# Patient Record
Sex: Female | Born: 1970 | Race: Black or African American | Hispanic: No | Marital: Single | Smoking: Never smoker
Health system: Southern US, Community
[De-identification: ages and names within clinical notes are randomized; demographics above are authoritative.]

## PROBLEM LIST (undated history)

## (undated) DIAGNOSIS — I10 Essential (primary) hypertension: Secondary | ICD-10-CM

## (undated) DIAGNOSIS — E119 Type 2 diabetes mellitus without complications: Secondary | ICD-10-CM

## (undated) DIAGNOSIS — H9190 Unspecified hearing loss, unspecified ear: Secondary | ICD-10-CM

## (undated) HISTORY — PX: ABDOMINAL HYSTERECTOMY: SHX81

---

## 2017-09-16 ENCOUNTER — Emergency Department (HOSPITAL_BASED_OUTPATIENT_CLINIC_OR_DEPARTMENT_OTHER): Payer: Medicare HMO

## 2017-09-16 ENCOUNTER — Other Ambulatory Visit: Payer: Self-pay

## 2017-09-16 ENCOUNTER — Emergency Department (HOSPITAL_BASED_OUTPATIENT_CLINIC_OR_DEPARTMENT_OTHER)
Admission: EM | Admit: 2017-09-16 | Discharge: 2017-09-17 | Disposition: A | Discharge status: Home / Self Care | Payer: Medicare HMO | Attending: Emergency Medicine | Admitting: Emergency Medicine

## 2017-09-16 ENCOUNTER — Encounter (HOSPITAL_BASED_OUTPATIENT_CLINIC_OR_DEPARTMENT_OTHER): Payer: Self-pay | Admitting: Emergency Medicine

## 2017-09-16 DIAGNOSIS — E876 Hypokalemia: Secondary | ICD-10-CM | POA: Insufficient documentation

## 2017-09-16 DIAGNOSIS — E119 Type 2 diabetes mellitus without complications: Secondary | ICD-10-CM | POA: Insufficient documentation

## 2017-09-16 DIAGNOSIS — R1110 Vomiting, unspecified: Secondary | ICD-10-CM | POA: Diagnosis present

## 2017-09-16 DIAGNOSIS — K529 Noninfective gastroenteritis and colitis, unspecified: Secondary | ICD-10-CM | POA: Diagnosis not present

## 2017-09-16 DIAGNOSIS — I10 Essential (primary) hypertension: Secondary | ICD-10-CM | POA: Diagnosis not present

## 2017-09-16 HISTORY — DX: Unspecified hearing loss, unspecified ear: H91.90

## 2017-09-16 HISTORY — DX: Essential (primary) hypertension: I10

## 2017-09-16 HISTORY — DX: Type 2 diabetes mellitus without complications: E11.9

## 2017-09-16 LAB — URINALYSIS, ROUTINE W REFLEX MICROSCOPIC
BILIRUBIN URINE: NEGATIVE
Glucose, UA: >=500 mg/dL — AB
Hgb urine dipstick: NEGATIVE
KETONES UR: NEGATIVE mg/dL
Leukocytes, UA: NEGATIVE
NITRITE: NEGATIVE
PROTEIN: NEGATIVE mg/dL
pH: 6.5 (ref 5.0–8.0)

## 2017-09-16 LAB — CBC
HCT: 39.6 % (ref 36.0–46.0)
HEMOGLOBIN: 13.1 g/dL (ref 12.0–15.0)
MCH: 26.3 pg (ref 26.0–34.0)
MCHC: 33.1 g/dL (ref 30.0–36.0)
MCV: 79.5 fL (ref 78.0–100.0)
Platelets: 514 10*3/uL — ABNORMAL HIGH (ref 150–400)
RBC: 4.98 MIL/uL (ref 3.87–5.11)
RDW: 14.4 % (ref 11.5–15.5)
WBC: 8.4 10*3/uL (ref 4.0–10.5)

## 2017-09-16 LAB — COMPREHENSIVE METABOLIC PANEL
ALK PHOS: 168 U/L — AB (ref 38–126)
ALT: 22 U/L (ref 0–44)
ANION GAP: 11 (ref 5–15)
AST: 23 U/L (ref 15–41)
Albumin: 3.4 g/dL — ABNORMAL LOW (ref 3.5–5.0)
BUN: 8 mg/dL (ref 6–20)
CALCIUM: 8.3 mg/dL — AB (ref 8.9–10.3)
CO2: 33 mmol/L — ABNORMAL HIGH (ref 22–32)
Chloride: 95 mmol/L — ABNORMAL LOW (ref 98–111)
Creatinine, Ser: 0.63 mg/dL (ref 0.44–1.00)
GFR calc Af Amer: >60 mL/min (ref >60)
GLUCOSE: 98 mg/dL (ref 70–99)
Potassium: 2.7 mmol/L — CL (ref 3.5–5.1)
SODIUM: 139 mmol/L (ref 135–145)
TOTAL PROTEIN: 7.9 g/dL (ref 6.5–8.1)
Total Bilirubin: 0.3 mg/dL (ref 0.3–1.2)

## 2017-09-16 LAB — MAGNESIUM: MAGNESIUM: 2.3 mg/dL (ref 1.7–2.4)

## 2017-09-16 LAB — URINALYSIS, MICROSCOPIC (REFLEX)

## 2017-09-16 LAB — LIPASE, BLOOD: LIPASE: 19 U/L (ref 11–51)

## 2017-09-16 MED ORDER — ONDANSETRON HCL 4 MG/2ML IJ SOLN: 4.0000 mg | Freq: Once | INTRAMUSCULAR | Status: DC

## 2017-09-16 MED ORDER — SODIUM CHLORIDE 0.9 % IV BOLUS
1000.0000 mL | Freq: Once | INTRAVENOUS | Status: AC
  Administered 2017-09-16: 1000 mL via INTRAVENOUS

## 2017-09-16 MED ORDER — POTASSIUM CHLORIDE CRYS ER 20 MEQ PO TBCR
40.0000 meq | EXTENDED_RELEASE_TABLET | Freq: Once | ORAL | Status: AC
  Administered 2017-09-17: 40 meq via ORAL
  Filled 2017-09-16: qty 2

## 2017-09-16 MED ORDER — IOPAMIDOL (ISOVUE-300) INJECTION 61%
100.0000 mL | Freq: Once | INTRAVENOUS | Status: AC | PRN
  Administered 2017-09-16: 100 mL via INTRAVENOUS

## 2017-09-16 MED ORDER — MORPHINE SULFATE (PF) 4 MG/ML IV SOLN: 4.0000 mg | Freq: Once | INTRAVENOUS | Status: DC

## 2017-09-16 MED ORDER — POTASSIUM CHLORIDE 10 MEQ/100ML IV SOLN
10.0000 meq | Freq: Once | INTRAVENOUS | Status: DC
  Filled 2017-09-16: qty 100

## 2017-09-16 MED ORDER — POTASSIUM CHLORIDE 10 MEQ/100ML IV SOLN
10.0000 meq | Freq: Once | INTRAVENOUS | Status: AC
  Administered 2017-09-16: 10 meq via INTRAVENOUS

## 2017-09-16 MED ORDER — SODIUM CHLORIDE 0.9 % IV SOLN
INTRAVENOUS | Status: DC | PRN
  Administered 2017-09-16: 1000 mL via INTRAVENOUS

## 2017-09-16 NOTE — ED Provider Notes (Signed)
[facility] EMERGENCY DEPARTMENT Provider Note   CSN: [id number] Arrival date & time: 09/16/17  1911     History   Chief Complaint Chief Complaint  Patient presents with  . Emesis    HPI Lauren Ali is a 47 y.o. female.  HPI  Lauren Ali is a 47 year old female with a history of type 2 diabetes and hypertension who presents to the emergency department for evaluation of nausea/vomiting, diarrhea and right-sided abdominal pain that radiates to the back.  Patient reports that her symptoms began yesterday.  Reports that abdominal pain has gradually worsened, comes in waves without any apparent trigger.  The pain feels aching and is located over the the right side of the abdomen and radiates towards the right flank.  Pain fluctuates between a 3/10 to 7/10 in severity.  Pain is worsened with laying on the right side and improved with laying flat on her back.  She tried taking some Tylenol which did not improve her symptoms.  She has had about 4-5 episodes of nonbloody, nonbilious emesis daily.  She is also had about 5 episodes of nonbloody diarrhea today.  Also had tactile fever with chills yesterday.  She has had prior hysterectomy and C-section, no other reported abdominal surgeries.  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  She denies any recent antibiotic use, hospitalizations or travel outside of the country.  She denies chest pain, shortness of breath, dysuria, urinary frequency, hematuria, vaginal discharge, vaginal bleeding, lightheadedness or syncope.  She checks her blood sugars at home which usually run in the 120s.  Past Medical History:  Diagnosis Date  . Diabetes mellitus without complication (HCC)   . Hard of hearing   . Hypertension     There are no active problems to display for this patient.   Past Surgical History:  Procedure Laterality Date  . ABDOMINAL HYSTERECTOMY       OB History    None      Home Medications    Prior to Admission medications   Not on File    Family History No family history on file.  Social History Social History   Tobacco Use  . Smoking status: Never Smoker  . Smokeless tobacco: Never Used  Substance Use Topics  . Alcohol use: Never    Frequency: Never  . Drug use: Not on file     Allergies   Lisinopril and Metformin and related   Review of Systems Review of Systems  Constitutional: Positive for chills and fever.  Eyes: Negative for visual disturbance.  Respiratory: Negative for shortness of breath.   Cardiovascular: Negative for chest pain.  Gastrointestinal: Positive for abdominal pain, diarrhea, nausea and vomiting. Negative for blood in stool.  Genitourinary: Negative for dysuria, flank pain, frequency, hematuria, vaginal bleeding and vaginal discharge.  Musculoskeletal: Negative for back pain.  Skin: Negative for wound.  Neurological: Negative for syncope and light-headedness.  Psychiatric/Behavioral: Negative for agitation.     Physical Exam Updated Vital Signs BP 109/73 (BP Location: Left Arm)   Pulse 82   Temp 99.4 F (37.4 C) (Oral)   Resp 18   Ht 5\' 3"  (1.6 m)   Wt 100.2 kg   SpO2 98%   BMI 39.15 kg/m   Physical Exam  Constitutional: She is oriented to person, place, and time. She appears well-developed and well-nourished. No distress.  HENT:  Head: Normocephalic and atraumatic.  Mucous membranes somewhat  dry  Eyes: Pupils are equal, round, and reactive to light. Conjunctivae are normal. Right eye exhibits no discharge. Left eye exhibits no discharge.  Neck: Normal range of motion. Neck supple.  Cardiovascular: Normal rate, regular rhythm and intact distal pulses.  No murmur heard. Pulmonary/Chest: Effort normal and breath sounds normal. No stridor. No respiratory distress. She has no wheezes. She has no rales.  Abdominal:  Abdomen soft and nondistended.  Tender to palpation in the right upper  and lower quadrants as well as right flank area.  No guarding, rebound or rigidity.  No CVA tenderness.  Musculoskeletal: Normal range of motion.  Neurological: She is alert and oriented to person, place, and time. Coordination normal.  Skin: Skin is warm and dry. She is not diaphoretic.  Psychiatric: She has a normal mood and affect. Her behavior is normal.  Nursing note and vitals reviewed.   ED Treatments / Results  Labs (all labs ordered are listed, but only abnormal results are displayed) Labs Reviewed  COMPREHENSIVE METABOLIC PANEL - Abnormal; Notable for the following components:      Result Value   Potassium 2.7 (*)    Chloride 95 (*)    CO2 33 (*)    Calcium 8.3 (*)    Albumin 3.4 (*)    Alkaline Phosphatase 168 (*)    All other components within normal limits  CBC - Abnormal; Notable for the following components:   Platelets 514 (*)    All other components within normal limits  URINALYSIS, ROUTINE W REFLEX MICROSCOPIC - Abnormal; Notable for the following components:   Specific Gravity, Urine <1.005 (*)    Glucose, UA >=500 (*)    All other components within normal limits  URINALYSIS, MICROSCOPIC (REFLEX) - Abnormal; Notable for the following components:   Bacteria, UA RARE (*)    All other components within normal limits  LIPASE, BLOOD  MAGNESIUM    EKG EKG Interpretation  Date/Time:  Sunday September 16 2017 22:36:36 EDT Ventricular Rate:  75 PR Interval:    QRS Duration: 100 QT Interval:  386 QTC Calculation: 432 R Axis:   61 Text Interpretation:  Sinus rhythm Borderline repolarization abnormality No oprior ECG for comparison.  T wave inversion in leads V2-V5.  No STEMI Confirmed by Theda Belfast ([id number]) on 09/16/2017 11:57:06 PM   Radiology Ct Abdomen Pelvis W Contrast  Result Date: 09/16/2017 CLINICAL DATA:  Right sided abdominal pain x1 week with vomiting x2 days and diarrhea. EXAM: CT ABDOMEN AND PELVIS WITH CONTRAST TECHNIQUE: Multidetector CT  imaging of the abdomen and pelvis was performed using the standard protocol following bolus administration of intravenous contrast. CONTRAST:  ISOVUE-300 IOPAMIDOL (ISOVUE-300) INJECTION 61% COMPARISON:  None. FINDINGS: Lower chest: Top-normal size heart.  No active pulmonary disease. Hepatobiliary: No focal liver abnormality is seen. No gallstones, gallbladder wall thickening, or biliary dilatation. Pancreas: Unremarkable. No pancreatic ductal dilatation or surrounding inflammatory changes. Spleen: Small splenule adjacent to normal size spleen.  No mass. Adrenals/Urinary Tract: Normal bilateral adrenal glands and kidneys without obstructive uropathy or mass. No hydroureteronephrosis. The urinary bladder is unremarkable. Stomach/Bowel: Small hiatal hernia. Decompressed stomach with normal small bowel rotation. Mild transmural thickening of fluid-filled small bowel loops in a pattern consistent with small bowel enteritis. Normal appendix and colon. Vascular/Lymphatic: Mild aortic atherosclerosis. No aneurysm. No lymphadenopathy. Reproductive: Hysterectomy.  No adnexal mass. Other: No free air nor free fluid. Small periumbilical fat containing hernia. Musculoskeletal: Slight disc space narrowing with endplate spurring V7-B9. No aggressive osseous lesions  nor fracture. IMPRESSION: Transmural thickening of fluid-filled mildly distended small bowel loops without mechanical bowel obstruction are identified in a pattern compatible with small bowel enteritis. Status post hysterectomy. Electronically Signed   By: Tollie Eth M.D.   On: 09/16/2017 23:30    Procedures Procedures (including critical care time)  Medications Ordered in ED Medications  ondansetron (ZOFRAN) injection 4 mg (4 mg Intravenous Not Given 09/16/17 2140)  potassium chloride 10 mEq in 100 mL IVPB ( Intravenous Rate/Dose Change 09/16/17 2338)  0.9 %  sodium chloride infusion ( Intravenous Rate/Dose Change 09/16/17 2337)  potassium chloride SA  (K-DUR,KLOR-CON) CR tablet 40 mEq (has no administration in time range)  sodium chloride 0.9 % bolus 1,000 mL (0 mLs Intravenous Stopped 09/16/17 2319)  iopamidol (ISOVUE-300) 61 % injection 100 mL (100 mLs Intravenous Contrast Given 09/16/17 2253)     Initial Impression / Assessment and Plan / ED Course  I have reviewed the triage vital signs and the nursing notes.  Pertinent labs & imaging results that were available during my care of the patient were reviewed by me and considered in my medical decision making (see chart for details).     CT abdomen/pelvis reveals transmural thickening of fluid-filled mildly distended small bowel loops without obstruction consistent with small bowel enteritis.  Patient symptoms began yesterday and she does report recent contact with someone who had similar symptoms.  Likely viral etiology.  She is afebrile and nontoxic in the emergency department.  CMP reveals hypokalemia with potassium 2.7.  This is likely related to her recent vomiting.  Magnesium within normal limits.  Patient treated with p.o. potassium as well as IV potassium.  Her EKG shows T wave inversions in V2-V5, no prior to compare to.  She denies chest pain or shortness of breath and I do not suspect ACS.  Otherwise urine without acute infection.  Lipase negative.  CBC unremarkable.  Patient tolerating p.o. fluids at the bedside.  Plan to discharge home with instructions on BRAT diet and zofran for nausea. Will also discharge with a a few days of PO potassium, and have her follow up with her PCP for recheck this week for repeat labs. Discussed return precautions and she agrees and appears reliable.   Final Clinical Impressions(s) / ED Diagnoses   Final diagnoses:  Enteritis  Hypokalemia    ED Discharge Orders         Ordered    ondansetron (ZOFRAN ODT) 4 MG disintegrating tablet  Every 8 hours PRN     09/17/17 0015    potassium chloride 20 MEQ TBCR  Daily     09/17/17 0015             Kellie Shropshire, PA-C 09/17/17 0017    Tegeler, Canary Brim, MD 09/17/17 [phone]

## 2017-09-16 NOTE — ED Notes (Signed)
ED Provider at bedside. 

## 2017-09-16 NOTE — ED Notes (Signed)
Pt expressed to EMT "I do not want the zofran or morphine. That morphine is too strong for me. I want something like tylenol or aleve." RN notified. PA notified.

## 2017-09-16 NOTE — ED Notes (Signed)
Called lab and add magnesium on

## 2017-09-16 NOTE — ED Triage Notes (Signed)
Pt c/o vomiting since yesterday.  

## 2017-09-16 NOTE — ED Notes (Signed)
Date and time results received: 09/16/17 .10:35 PM   (use smartphrase ".now" to insert current time)  Test: Serum Potassium Critical Value: 2.7  Name of Provider Notified: Claudia Desanctis, PA  Orders Received? Or Actions Taken: Orders received

## 2017-09-17 DIAGNOSIS — K529 Noninfective gastroenteritis and colitis, unspecified: Secondary | ICD-10-CM | POA: Diagnosis not present

## 2017-09-17 MED ORDER — ONDANSETRON 4 MG PO TBDP: 4.0000 mg | ORAL_TABLET | Freq: Three times a day (TID) | ORAL | 0 refills | Status: AC | PRN

## 2017-09-17 MED ORDER — POTASSIUM CHLORIDE ER 20 MEQ PO TBCR: 20.0000 meq | EXTENDED_RELEASE_TABLET | Freq: Every day | ORAL | 0 refills | Status: AC

## 2017-09-17 NOTE — Discharge Instructions (Addendum)
You have stomach flu.  Take Zofran as needed for nausea and vomiting at home.  Stick with a bland diet, no greasy or spicy foods.  Your potassium was low today, please take potassium pill daily for the next 5 days and follow-up with your regular doctor for recheck later this week.  Return to the ER if you have any new or worsening symptoms like vomiting that will not stop, fever, worsening abdominal pain.

## 2017-09-17 NOTE — ED Notes (Signed)
Water given for PO challenge.

## 2019-01-27 ENCOUNTER — Emergency Department (HOSPITAL_BASED_OUTPATIENT_CLINIC_OR_DEPARTMENT_OTHER): Payer: Medicare HMO

## 2019-01-27 ENCOUNTER — Emergency Department (HOSPITAL_BASED_OUTPATIENT_CLINIC_OR_DEPARTMENT_OTHER)
Admission: EM | Admit: 2019-01-27 | Discharge: 2019-01-27 | Disposition: A | Discharge status: Home / Self Care | Payer: Medicare HMO | Attending: Emergency Medicine | Admitting: Emergency Medicine

## 2019-01-27 ENCOUNTER — Other Ambulatory Visit: Payer: Self-pay

## 2019-01-27 ENCOUNTER — Encounter (HOSPITAL_BASED_OUTPATIENT_CLINIC_OR_DEPARTMENT_OTHER): Payer: Self-pay | Admitting: *Deleted

## 2019-01-27 DIAGNOSIS — U071 COVID-19: Secondary | ICD-10-CM | POA: Insufficient documentation

## 2019-01-27 DIAGNOSIS — E876 Hypokalemia: Secondary | ICD-10-CM | POA: Diagnosis not present

## 2019-01-27 DIAGNOSIS — Z79899 Other long term (current) drug therapy: Secondary | ICD-10-CM | POA: Diagnosis not present

## 2019-01-27 DIAGNOSIS — I10 Essential (primary) hypertension: Secondary | ICD-10-CM | POA: Diagnosis not present

## 2019-01-27 DIAGNOSIS — Z888 Allergy status to other drugs, medicaments and biological substances status: Secondary | ICD-10-CM | POA: Insufficient documentation

## 2019-01-27 DIAGNOSIS — E1165 Type 2 diabetes mellitus with hyperglycemia: Secondary | ICD-10-CM | POA: Diagnosis present

## 2019-01-27 DIAGNOSIS — Z794 Long term (current) use of insulin: Secondary | ICD-10-CM | POA: Diagnosis not present

## 2019-01-27 DIAGNOSIS — R739 Hyperglycemia, unspecified: Secondary | ICD-10-CM

## 2019-01-27 LAB — COMPREHENSIVE METABOLIC PANEL
ALT: 28 U/L (ref 0–44)
AST: 29 U/L (ref 15–41)
Albumin: 3.6 g/dL (ref 3.5–5.0)
Alkaline Phosphatase: 182 U/L — ABNORMAL HIGH (ref 38–126)
Anion gap: 13 (ref 5–15)
BUN: 20 mg/dL (ref 6–20)
CO2: 29 mmol/L (ref 22–32)
Calcium: 8.4 mg/dL — ABNORMAL LOW (ref 8.9–10.3)
Chloride: 91 mmol/L — ABNORMAL LOW (ref 98–111)
Creatinine, Ser: 1.11 mg/dL — ABNORMAL HIGH (ref 0.44–1.00)
GFR calc Af Amer: >60 mL/min (ref >60)
GFR calc non Af Amer: 59 mL/min — ABNORMAL LOW (ref >60)
Glucose, Bld: 300 mg/dL — ABNORMAL HIGH (ref 70–99)
Potassium: 2.9 mmol/L — ABNORMAL LOW (ref 3.5–5.1)
Sodium: 133 mmol/L — ABNORMAL LOW (ref 135–145)
Total Bilirubin: 0.2 mg/dL — ABNORMAL LOW (ref 0.3–1.2)
Total Protein: 8.3 g/dL — ABNORMAL HIGH (ref 6.5–8.1)

## 2019-01-27 LAB — CBC WITH DIFFERENTIAL/PLATELET
Abs Immature Granulocytes: 0.01 10*3/uL (ref 0.00–0.07)
Basophils Absolute: 0 10*3/uL (ref 0.0–0.1)
Basophils Relative: 0 %
Eosinophils Absolute: 0 10*3/uL (ref 0.0–0.5)
Eosinophils Relative: 0 %
HCT: 43.4 % (ref 36.0–46.0)
Hemoglobin: 13.7 g/dL (ref 12.0–15.0)
Immature Granulocytes: 0 %
Lymphocytes Relative: 30 %
Lymphs Abs: 1.3 10*3/uL (ref 0.7–4.0)
MCH: 25.2 pg — ABNORMAL LOW (ref 26.0–34.0)
MCHC: 31.6 g/dL (ref 30.0–36.0)
MCV: 79.9 fL — ABNORMAL LOW (ref 80.0–100.0)
Monocytes Absolute: 0.5 10*3/uL (ref 0.1–1.0)
Monocytes Relative: 11 %
Neutro Abs: 2.5 10*3/uL (ref 1.7–7.7)
Neutrophils Relative %: 59 %
Platelets: 430 10*3/uL — ABNORMAL HIGH (ref 150–400)
RBC: 5.43 MIL/uL — ABNORMAL HIGH (ref 3.87–5.11)
RDW: 14.6 % (ref 11.5–15.5)
WBC: 4.3 10*3/uL (ref 4.0–10.5)
nRBC: 0 % (ref 0.0–0.2)

## 2019-01-27 LAB — SARS CORONAVIRUS 2 AG (30 MIN TAT): SARS Coronavirus 2 Ag: POSITIVE — AB

## 2019-01-27 LAB — CBG MONITORING, ED
Glucose-Capillary: 165 mg/dL — ABNORMAL HIGH (ref 70–99)
Glucose-Capillary: 175 mg/dL — ABNORMAL HIGH (ref 70–99)

## 2019-01-27 MED ORDER — POTASSIUM CHLORIDE CRYS ER 20 MEQ PO TBCR
40.0000 meq | EXTENDED_RELEASE_TABLET | Freq: Once | ORAL | Status: AC
  Administered 2019-01-27: 40 meq via ORAL
  Filled 2019-01-27: qty 2

## 2019-01-27 MED ORDER — INSULIN REGULAR HUMAN 100 UNIT/ML IJ SOLN: 3.0000 [IU] | Freq: Once | INTRAMUSCULAR | Status: DC

## 2019-01-27 MED ORDER — POTASSIUM CHLORIDE CRYS ER 20 MEQ PO TBCR: 20.0000 meq | EXTENDED_RELEASE_TABLET | Freq: Two times a day (BID) | ORAL | 0 refills | Status: AC

## 2019-01-27 NOTE — Discharge Instructions (Signed)
Person Under Monitoring Name: Lauren Ali  Location: [address] [place] [address]   Infection Prevention Recommendations for Individuals Confirmed to have, or Being Evaluated for, 2019 Novel Coronavirus (COVID-19) Infection Who Receive Care at Home  Individuals who are confirmed to have, or are being evaluated for, COVID-19 should follow the prevention steps below until a healthcare provider or local or state health department says they can return to normal activities.  Stay home except to get medical care You should restrict activities outside your home, except for getting medical care. Do not go to work, school, or public areas, and do not use public transportation or taxis.  Call ahead before visiting your doctor Before your medical appointment, call the healthcare provider and tell them that you have, or are being evaluated for, COVID-19 infection. This will help the healthcare provider's office take steps to keep other people from getting infected. Ask your healthcare provider to call the local or state health department.  Monitor your symptoms Seek prompt medical attention if your illness is worsening (e.g., difficulty breathing). Before going to your medical appointment, call the healthcare provider and tell them that you have, or are being evaluated for, COVID-19 infection. Ask your healthcare provider to call the local or state health department.  Wear a facemask You should wear a facemask that covers your nose and mouth when you are in the same room with other people and when you visit a healthcare provider. People who live with or visit you should also wear a facemask while they are in the same room with you.  Separate yourself from other people in your home As much as possible, you should stay in a different room from other people in your home. Also, you should use a separate bathroom, if available.  Avoid sharing household items You should not share  dishes, drinking glasses, cups, eating utensils, towels, bedding, or other items with other people in your home. After using these items, you should wash them thoroughly with soap and water.  Cover your coughs and sneezes Cover your mouth and nose with a tissue when you cough or sneeze, or you can cough or sneeze into your sleeve. Throw used tissues in a lined trash can, and immediately wash your hands with soap and water for at least 20 seconds or use an alcohol-based hand rub.  Wash your [employer] your hands often and thoroughly with soap and water for at least 20 seconds. You can use an alcohol-based hand sanitizer if soap and water are not available and if your hands are not visibly dirty. Avoid touching your eyes, nose, and mouth with unwashed hands.   Prevention Steps for Caregivers and Household Members of Individuals Confirmed to have, or Being Evaluated for, COVID-19 Infection Being Cared for in the Home  If you live with, or provide care at home for, a person confirmed to have, or being evaluated for, COVID-19 infection please follow these guidelines to prevent infection:  Follow healthcare provider's instructions Make sure that you understand and can help the patient follow any healthcare provider instructions for all care.  Provide for the patient's basic needs You should help the patient with basic needs in the home and provide support for getting groceries, prescriptions, and other personal needs.  Monitor the patient's symptoms If they are getting sicker, call his or her medical provider and tell them that the patient has, or is being evaluated for, COVID-19 infection. This will help the healthcare provider's office  take steps to keep other people from getting infected. Ask the healthcare provider to call the local or state health department.  Limit the number of people who have contact with the patient If possible, have only one caregiver for the patient. Other  household members should stay in another home or place of residence. If this is not possible, they should stay in another room, or be separated from the patient as much as possible. Use a separate bathroom, if available. Restrict visitors who do not have an essential need to be in the home.  Keep older adults, very young children, and other sick people away from the patient Keep older adults, very young children, and those who have compromised immune systems or chronic health conditions away from the patient. This includes people with chronic heart, lung, or kidney conditions, diabetes, and cancer.  Ensure good ventilation Make sure that shared spaces in the home have good air flow, such as from an air conditioner or an opened window, weather permitting.  Wash your hands often Wash your hands often and thoroughly with soap and water for at least 20 seconds. You can use an alcohol based hand sanitizer if soap and water are not available and if your hands are not visibly dirty. Avoid touching your eyes, nose, and mouth with unwashed hands. Use disposable paper towels to dry your hands. If not available, use dedicated cloth towels and replace them when they become wet.  Wear a facemask and gloves Wear a disposable facemask at all times in the room and gloves when you touch or have contact with the patient's blood, body fluids, and/or secretions or excretions, such as sweat, saliva, sputum, nasal mucus, vomit, urine, or feces.  Ensure the mask fits over your nose and mouth tightly, and do not touch it during use. Throw out disposable facemasks and gloves after using them. Do not reuse. Wash your hands immediately after removing your facemask and gloves. If your personal clothing becomes contaminated, carefully remove clothing and launder. Wash your hands after handling contaminated clothing. Place all used disposable facemasks, gloves, and other waste in a lined container before disposing them with  other household waste. Remove gloves and wash your hands immediately after handling these items.  Do not share dishes, glasses, or other household items with the patient Avoid sharing household items. You should not share dishes, drinking glasses, cups, eating utensils, towels, bedding, or other items with a patient who is confirmed to have, or being evaluated for, COVID-19 infection. After the person uses these items, you should wash them thoroughly with soap and water.  Wash laundry thoroughly Immediately remove and wash clothes or bedding that have blood, body fluids, and/or secretions or excretions, such as sweat, saliva, sputum, nasal mucus, vomit, urine, or feces, on them. Wear gloves when handling laundry from the patient. Read and follow directions on labels of laundry or clothing items and detergent. In general, wash and dry with the warmest temperatures recommended on the label.  Clean all areas the individual has used often Clean all touchable surfaces, such as counters, tabletops, doorknobs, bathroom fixtures, toilets, phones, keyboards, tablets, and bedside tables, every day. Also, clean any surfaces that may have blood, body fluids, and/or secretions or excretions on them. Wear gloves when cleaning surfaces the patient has come in contact with. Use a diluted bleach solution (e.g., dilute bleach with 1 part bleach and 10 parts water) or a household disinfectant with a label that says EPA-registered for coronaviruses. To make a bleach  solution at home, add 1 tablespoon of bleach to 1 quart (4 cups) of water. For a larger supply, add  cup of bleach to 1 gallon (16 cups) of water. Read labels of cleaning products and follow recommendations provided on product labels. Labels contain instructions for safe and effective use of the cleaning product including precautions you should take when applying the product, such as wearing gloves or eye protection and making sure you have good ventilation  during use of the product. Remove gloves and wash hands immediately after cleaning.  Monitor yourself for signs and symptoms of illness Caregivers and household members are considered close contacts, should monitor their health, and will be asked to limit movement outside of the home to the extent possible. Follow the monitoring steps for close contacts listed on the symptom monitoring form.   ? If you have additional questions, contact your local health department or call the epidemiologist on call at [phone] (available 24/7). ? This guidance is subject to change. For the most up-to-date guidance from [facility], please refer to their website: YouBlogs.pl

## 2019-01-27 NOTE — ED Notes (Signed)
ED Provider at bedside. 

## 2019-01-27 NOTE — ED Notes (Signed)
Xray bedside.

## 2019-01-27 NOTE — ED Triage Notes (Signed)
Fatigue. She is diabetic and her sugar is high. Abdominal full feeling.

## 2019-01-27 NOTE — ED Notes (Signed)
Vital signs stable. 

## 2019-01-27 NOTE — ED Notes (Signed)
Called for pt in [facility] but no answer.

## 2019-01-27 NOTE — ED Notes (Signed)
Pt ambulated out of room for bathroom with no problems, explained to pt that with her precautions she will need to stay in room. Bedside toilet brought into room.

## 2019-01-27 NOTE — ED Notes (Signed)
Pt in room sleeping, updated vitals and explained delay. Pt appreciative.

## 2019-01-28 ENCOUNTER — Encounter (HOSPITAL_BASED_OUTPATIENT_CLINIC_OR_DEPARTMENT_OTHER): Payer: Self-pay | Admitting: Emergency Medicine

## 2019-01-28 MED FILL — POTASSIUM CHLORIDE CRYS ER: 20 | 5 days supply | Qty: 10 | Fill #0

## 2019-01-28 NOTE — ED Provider Notes (Signed)
[facility] Provider Note   CSN: [id number] Arrival date & time: 01/27/19  1622     History Chief Complaint  Patient presents with  . Hyperglycemia    + covid    Lauren Ali is a 49 y.o. female.  The history is provided by the patient.  Hyperglycemia Severity:  Moderate Onset quality:  Gradual Timing:  Constant Progression:  Unchanged Chronicity:  Recurrent Diabetes status:  Controlled with insulin and controlled with oral medications Current diabetic therapy:  Insulin, treseba, actos Context: recent illness   Context comment:  Has covid Relieved by:  Nothing Ineffective treatments:  None tried Associated symptoms: no abdominal pain, no altered mental status, no blurred vision, no chest pain, no dehydration, no dizziness, no fatigue, no increased appetite, no increased thirst, no nausea, no shortness of breath, no syncope and no vomiting   Risk factors: obesity   Risk factors: no family hx of diabetes   Patient presents with increased sugars since being diagnosed with covid.       Past Medical History:  Diagnosis Date  . Diabetes mellitus without complication ([place])   . Hard of hearing   . Hypertension     There are no problems to display for this patient.   Past Surgical History:  Procedure Laterality Date  . ABDOMINAL HYSTERECTOMY       OB History   No obstetric history on file.     History reviewed. No pertinent family history.  Social History   Tobacco Use  . Smoking status: Never Smoker  . Smokeless tobacco: Never Used  Substance Use Topics  . Alcohol use: Never  . Drug use: Not on file    Home Medications Prior to Admission medications   Medication Sig Start Date End Date Taking? Authorizing Provider  atorvastatin (LIPITOR) 40 MG tablet Take by mouth. 07/03/18  Yes [provider]  Blood Glucose Monitoring Suppl (FIFTY50 GLUCOSE METER 2.0) w/Device KIT Use as instructed. E11.65 03/06/16  Yes  [provider]  chlorthalidone (HYGROTON) 25 MG tablet TAKE 1 TABLET BY MOUTH EVERY DAY 07/25/18  Yes [provider]  empagliflozin (JARDIANCE) 25 MG TABS tablet Take by mouth. 06/17/18  Yes [provider]  fluconazole (DIFLUCAN) 200 MG tablet TAKE 1 TABLET BY MOUTH AS NEEDED. REPEAT DOSE AFTER 2 DAYS IF SYMPTOMS STILL PERSIST 12/26/16  Yes [provider]  glucose blood (ONETOUCH ULTRA) test strip USE TO TEST BLOOD SUGAR THREE TIMES DAILY 03/25/18  Yes [provider]  insulin degludec (TRESIBA) 100 UNIT/ML SOPN FlexTouch Pen Inject 80 units daily. 06/17/18  Yes [provider]  Insulin Pen Needle (MM PEN NEEDLES) 32G X 4 MM MISC USE DAILY AS DIRECTED WITH LEVEMIR 01/16/19  Yes [provider]  pioglitazone (ACTOS) 30 MG tablet Take by mouth. 08/07/18  Yes [provider]  potassium chloride SA (KLOR-CON) 20 MEQ tablet Take by mouth. 09/17/17  Yes [provider]  ondansetron (ZOFRAN ODT) 4 MG disintegrating tablet Take 1 tablet (4 mg total) by mouth every 8 (eight) hours as needed for nausea or vomiting. 09/17/17   Glyn Ade, PA-C  potassium chloride 20 MEQ TBCR Take 20 mEq by mouth daily for 5 days. 09/17/17 09/22/17  Glyn Ade, PA-C  potassium chloride SA (KLOR-CON) 20 MEQ tablet Take 1 tablet (20 mEq total) by mouth 2 (two) times daily. 01/27/19   Glendine Swetz, MD    Allergies    Lisinopril and Metformin and related  Review  of Systems   Review of Systems  Constitutional: Negative for fatigue.  HENT: Negative for congestion.   Eyes: Negative for blurred vision and visual disturbance.  Respiratory: Negative for shortness of breath.   Cardiovascular: Negative for chest pain and syncope.  Gastrointestinal: Negative for abdominal pain, nausea and vomiting.  Endocrine: Negative for polydipsia.  Genitourinary: Negative for difficulty urinating.  Musculoskeletal: Negative for arthralgias.  Neurological:  Negative for dizziness.  Psychiatric/Behavioral: Negative for agitation.  All other systems reviewed and are negative.   Physical Exam Updated Vital Signs BP 110/69 (BP Location: Right Arm)   Pulse 81   Temp 99 F (37.2 C) (Oral)   Resp 17   Ht 5' 3"  (1.6 m)   Wt 103.9 kg   SpO2 98%   BMI 40.57 kg/m   Physical Exam Vitals and nursing note reviewed.  Constitutional:      General: She is not in acute distress.    Appearance: Normal appearance.  HENT:     Head: Normocephalic and atraumatic.     Nose: Nose normal.  Eyes:     Conjunctiva/sclera: Conjunctivae normal.     Pupils: Pupils are equal, round, and reactive to light.  Cardiovascular:     Rate and Rhythm: Normal rate and regular rhythm.     Pulses: Normal pulses.     Heart sounds: Normal heart sounds.  Pulmonary:     Effort: Pulmonary effort is normal.     Breath sounds: Normal breath sounds.  Abdominal:     General: Abdomen is flat. Bowel sounds are normal.     Tenderness: There is no abdominal tenderness. There is no guarding or rebound.     Hernia: No hernia is present.  Musculoskeletal:        General: Normal range of motion.     Cervical back: Normal range of motion and neck supple.  Skin:    General: Skin is warm and dry.     Capillary Refill: Capillary refill takes less than 2 seconds.  Neurological:     General: No focal deficit present.     Mental Status: She is alert and oriented to person, place, and time.  Psychiatric:        Mood and Affect: Mood normal.        Behavior: Behavior normal.     ED Results / Procedures / Treatments   Labs (all labs ordered are listed, but only abnormal results are displayed) Results for orders placed or performed during the hospital encounter of 01/27/19  SARS Coronavirus 2 Ag (30 min TAT) - Nasal Swab (BD Veritor Kit)   Specimen: Nasal Swab (BD Veritor Kit)  Result Value Ref Range   SARS Coronavirus 2 Ag POSITIVE (A) NEGATIVE  CBC with Differential  Result  Value Ref Range   WBC 4.3 4.0 - 10.5 K/uL   RBC 5.43 (H) 3.87 - 5.11 MIL/uL   Hemoglobin 13.7 12.0 - 15.0 g/dL   HCT 43.4 36.0 - 46.0 %   MCV 79.9 (L) 80.0 - 100.0 fL   MCH 25.2 (L) 26.0 - 34.0 pg   MCHC 31.6 30.0 - 36.0 g/dL   RDW 14.6 11.5 - 15.5 %   Platelets 430 (H) 150 - 400 K/uL   nRBC 0.0 0.0 - 0.2 %   Neutrophils Relative % 59 %   Neutro Abs 2.5 1.7 - 7.7 K/uL   Lymphocytes Relative 30 %   Lymphs Abs 1.3 0.7 - 4.0 K/uL   Monocytes Relative 11 %  Monocytes Absolute 0.5 0.1 - 1.0 K/uL   Eosinophils Relative 0 %   Eosinophils Absolute 0.0 0.0 - 0.5 K/uL   Basophils Relative 0 %   Basophils Absolute 0.0 0.0 - 0.1 K/uL   Immature Granulocytes 0 %   Abs Immature Granulocytes 0.01 0.00 - 0.07 K/uL  Comprehensive metabolic panel  Result Value Ref Range   Sodium 133 (L) 135 - 145 mmol/L   Potassium 2.9 (L) 3.5 - 5.1 mmol/L   Chloride 91 (L) 98 - 111 mmol/L   CO2 29 22 - 32 mmol/L   Glucose, Bld 300 (H) 70 - 99 mg/dL   BUN 20 6 - 20 mg/dL   Creatinine, Ser 1.11 (H) 0.44 - 1.00 mg/dL   Calcium 8.4 (L) 8.9 - 10.3 mg/dL   Total Protein 8.3 (H) 6.5 - 8.1 g/dL   Albumin 3.6 3.5 - 5.0 g/dL   AST 29 15 - 41 U/L   ALT 28 0 - 44 U/L   Alkaline Phosphatase 182 (H) 38 - 126 U/L   Total Bilirubin 0.2 (L) 0.3 - 1.2 mg/dL   GFR calc non Af Amer 59 (L) >60 mL/min   GFR calc Af Amer >60 >60 mL/min   Anion gap 13 5 - 15  POC CBG, ED  Result Value Ref Range   Glucose-Capillary 165 (H) 70 - 99 mg/dL  POC CBG, ED  Result Value Ref Range   Glucose-Capillary 175 (H) 70 - 99 mg/dL   DG Chest Port 1 View  Result Date: 01/27/2019 CLINICAL DATA:  COVID positive EXAM: PORTABLE CHEST 1 VIEW COMPARISON:  December 25, 2015 FINDINGS: The heart size and mediastinal contours are within normal limits. Both lungs are clear. The visualized skeletal structures are unremarkable. IMPRESSION: No active disease. Electronically Signed   By: Prudencio Pair M.D.   On: 01/27/2019 23:05     Radiology DG Chest  Port 1 View  Result Date: 01/27/2019 CLINICAL DATA:  COVID positive EXAM: PORTABLE CHEST 1 VIEW COMPARISON:  December 25, 2015 FINDINGS: The heart size and mediastinal contours are within normal limits. Both lungs are clear. The visualized skeletal structures are unremarkable. IMPRESSION: No active disease. Electronically Signed   By: Prudencio Pair M.D.   On: 01/27/2019 23:05    Procedures Procedures (including critical care time)  Medications Ordered in ED Medications  potassium chloride SA (KLOR-CON) CR tablet 40 mEq (40 mEq Oral Given 01/27/19 2333)    ED Course  I have reviewed the triage vital signs and the nursing notes.  Pertinent labs & imaging results that were available during my care of the patient were reviewed by me and considered in my medical decision making (see chart for details).    pATIENT has chronic hypokalemia, this has been seen time and again on labs.  I have given some in the ED and written for more to be taken at home.  Her sugar is now fine.  I have explained that her sugar could be up due to the stress of infection and she must watch her diet more closely during the infection as she she check her sugar more often at home.  She is well appearing. CXR and vitals and exam are benign and reassuring.    Lauren Ali was evaluated in Emergency Department on 01/28/2019 for the symptoms described in the history of present illness. She was evaluated in the context of the global COVID-19 pandemic, which necessitated consideration that the patient might be at risk for infection with the  SARS-CoV-2 virus that causes COVID-19. Institutional protocols and algorithms that pertain to the evaluation of patients at risk for COVID-19 are in a state of rapid change based on information released by regulatory bodies including the CDC and federal and state organizations. These policies and algorithms were followed during the patient's care in the ED.  Final Clinical Impression(s) / ED  Diagnoses Final diagnoses:  Hyperglycemia  COVID-19  Hypokalemia   Return for intractable cough, coughing up blood,fevers >100.4 unrelieved by medication, shortness of breath, intractable vomiting, chest pain, shortness of breath, weakness,numbness, changes in speech, facial asymmetry,abdominal pain, passing out,Inability to tolerate liquids or food, cough, altered mental status or any concerns. No signs of systemic illness or infection. The patient is nontoxic-appearing on exam and vital signs are within normal limits.   I have reviewed the triage vital signs and the nursing notes. Pertinent labs &imaging results that were available during my care of the patient were reviewed by me and considered in my medical decision making (see chart for details).  After history, exam, and medical workup I feel the patient has been appropriately medically screened and is safe for discharge home. Pertinent diagnoses were discussed with the patient. Patient was given return Rx / DC Orders ED Discharge Orders         Ordered    potassium chloride SA (KLOR-CON) 20 MEQ tablet  2 times daily     01/27/19 2326           Hazely Sealey, MD 01/28/19 6815

## 2019-02-05 ENCOUNTER — Telehealth (HOSPITAL_BASED_OUTPATIENT_CLINIC_OR_DEPARTMENT_OTHER): Payer: Self-pay | Admitting: Emergency Medicine

## 2019-02-08 ENCOUNTER — Telehealth (HOSPITAL_BASED_OUTPATIENT_CLINIC_OR_DEPARTMENT_OTHER): Payer: Self-pay | Admitting: Emergency Medicine

## 2019-12-11 IMAGING — CT CT ABD-PELV W/ CM
2 of 5 series · 17 of 46 positions shown, 19 images · IV contrast (APPLIED)
Comparison: None.

CLINICAL DATA: Right sided abdominal pain x1 week with vomiting x2
days and diarrhea.

EXAM:
CT ABDOMEN AND PELVIS WITH CONTRAST
TECHNIQUE: Multidetector CT imaging of the abdomen and pelvis was performed
using the standard protocol following bolus administration of
intravenous contrast.
CONTRAST:  100mL QO8IM2-XJJ IOPAMIDOL (QO8IM2-XJJ) INJECTION 61%

[Series 2: axial st · axial · 0.90mm/px · z∈[-415,+15]mm · 14 of 96 slices shown, 16 images]
[im 5/96  soft-tissue]
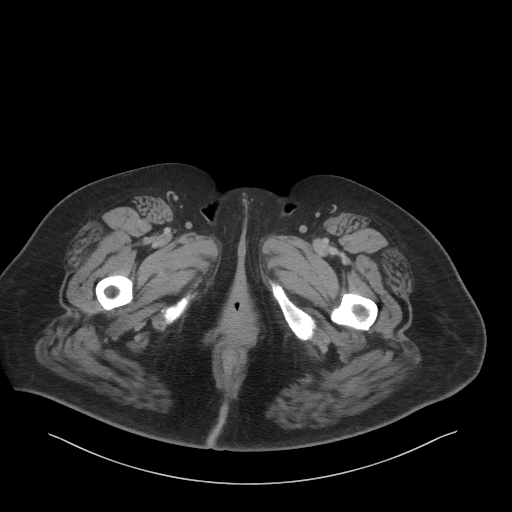
[im 5/96  bone]
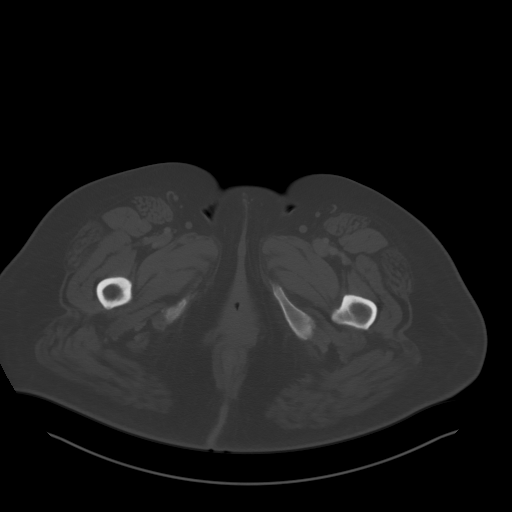
[im 15/96  soft-tissue]
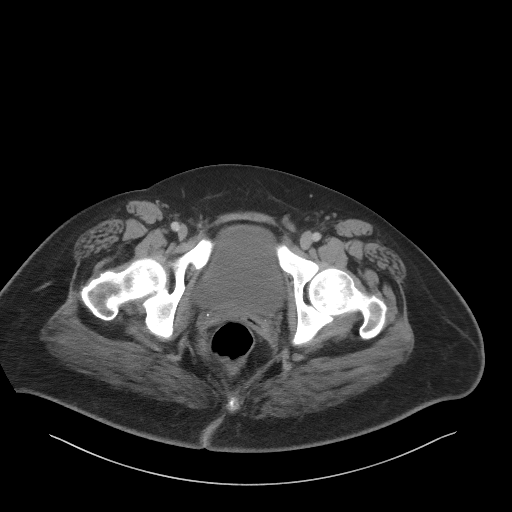
[im 20/96  soft-tissue]
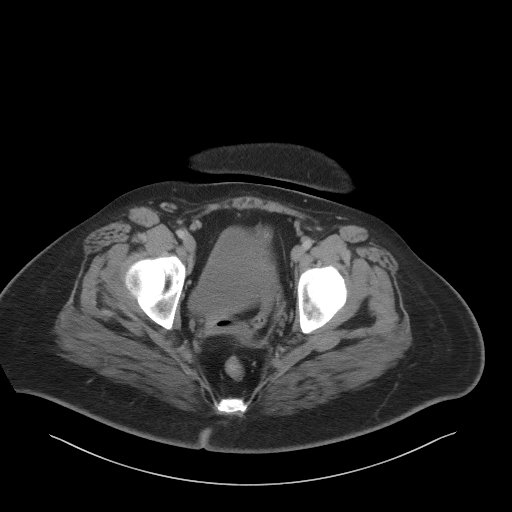
[im 24/96  soft-tissue]
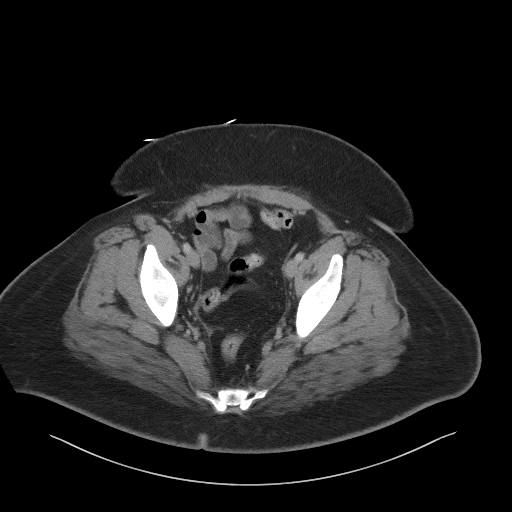
[im 34/96  soft-tissue]
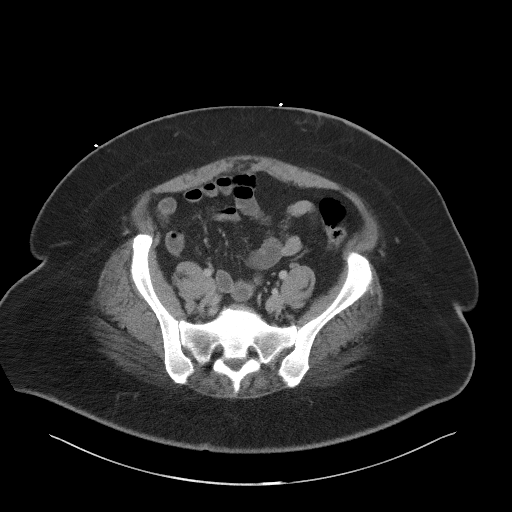
[im 39/96  soft-tissue]
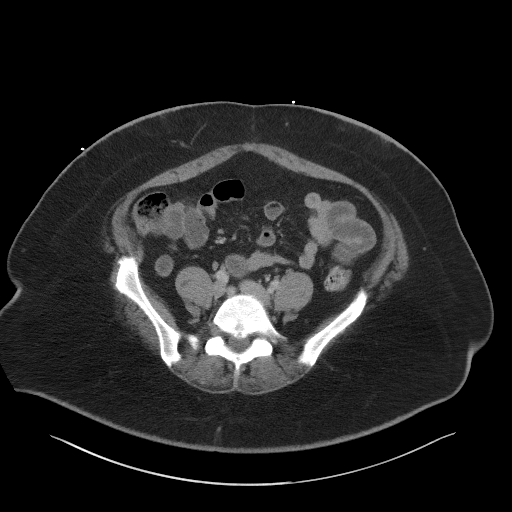
[im 43/96  soft-tissue]
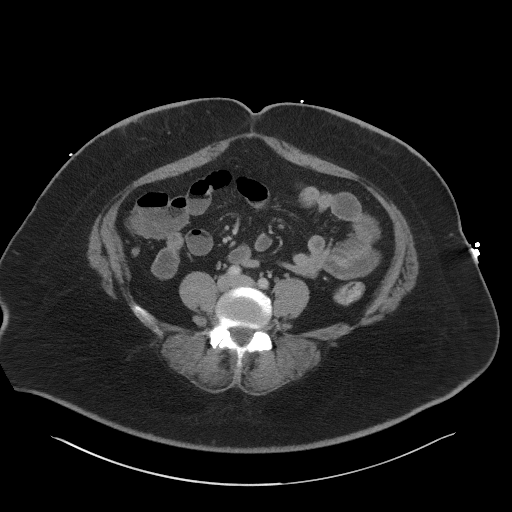
[im 53/96  soft-tissue]
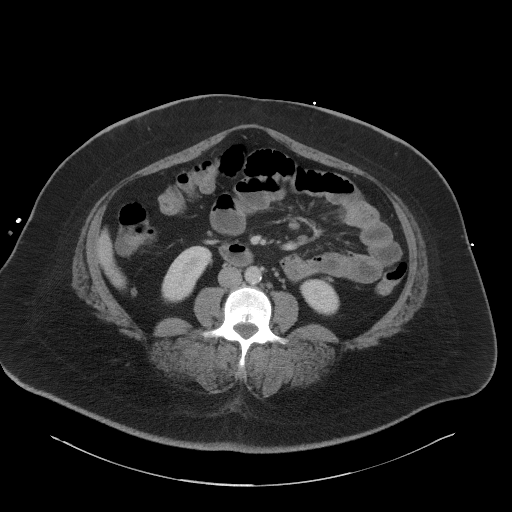
[im 58/96  soft-tissue]
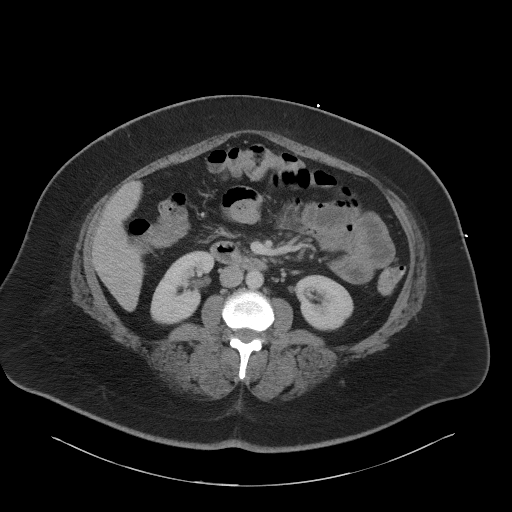
[im 58/96  bone]
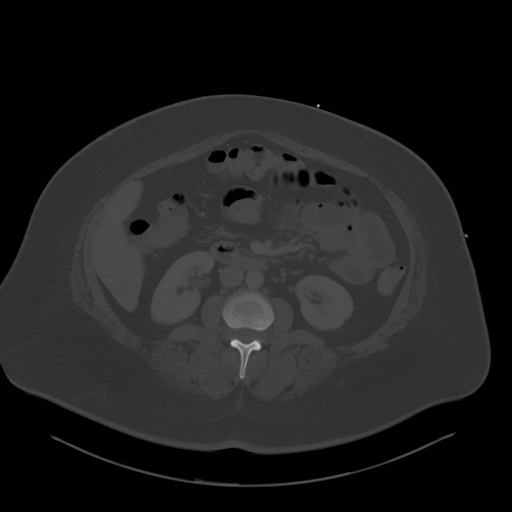
[im 62/96  soft-tissue]
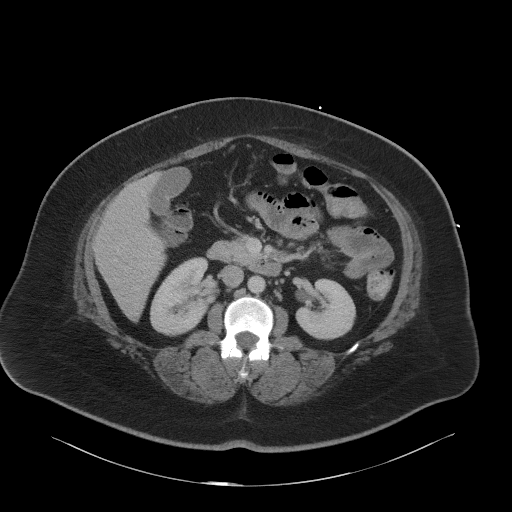
[im 72/96  soft-tissue]
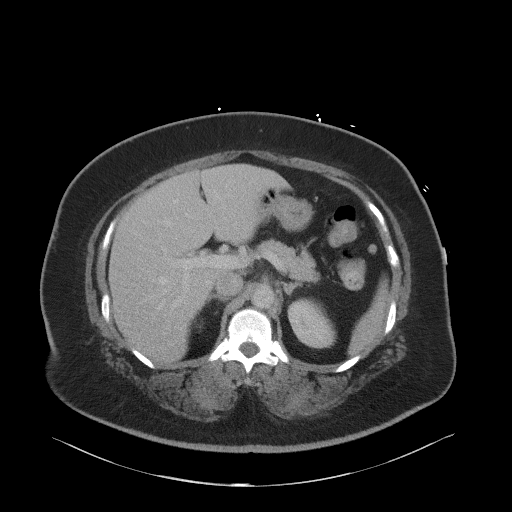
[im 77/96  soft-tissue]
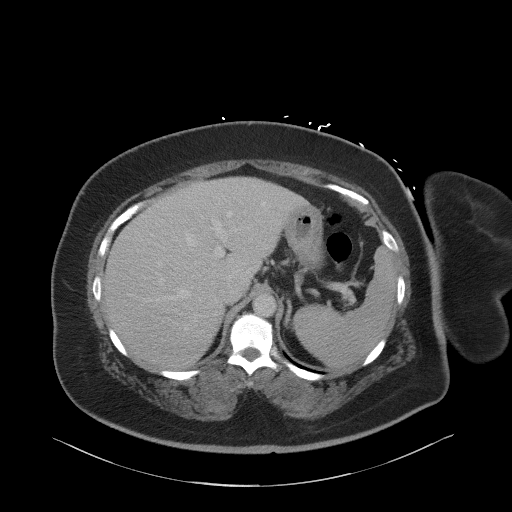
[im 81/96  soft-tissue]
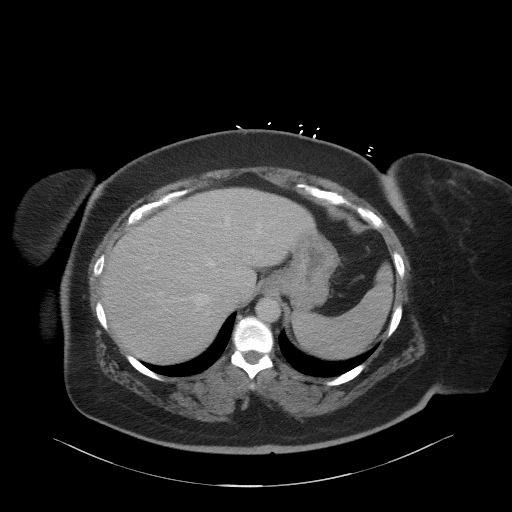
[im 91/96  soft-tissue]
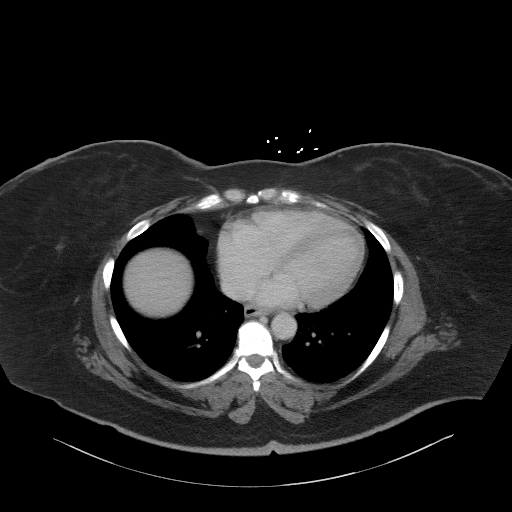

[Series 5: coronal st · coronal · 0.95mm/px · 3 of 109 slices shown]
[im 37/109  soft-tissue]
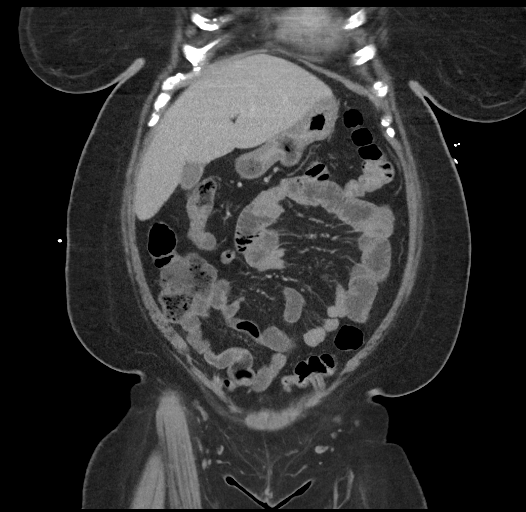
[im 49/109  soft-tissue]
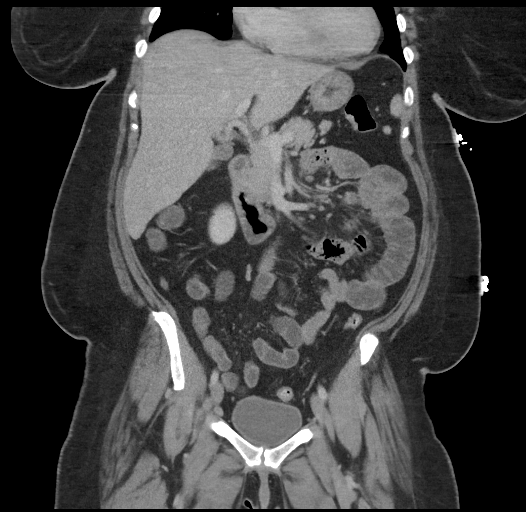
[im 61/109  soft-tissue]
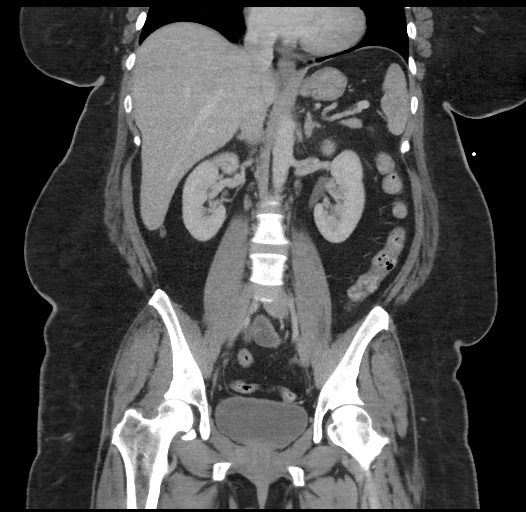

[17 of 46 positions shown; findings below may reference images not displayed]

FINDINGS: Lower chest: Top-normal size heart.  No active pulmonary disease.

Hepatobiliary: No focal liver abnormality is seen. No gallstones,
gallbladder wall thickening, or biliary dilatation.

Pancreas: Unremarkable. No pancreatic ductal dilatation or
surrounding inflammatory changes.

Spleen: Small splenule adjacent to normal size spleen.  No mass.

Adrenals/Urinary Tract: Normal bilateral adrenal glands and kidneys
without obstructive uropathy or mass. No hydroureteronephrosis. The
urinary bladder is unremarkable.

Stomach/Bowel: Small hiatal hernia. Decompressed stomach with normal
small bowel rotation. Mild transmural thickening of fluid-filled
small bowel loops in a pattern consistent with small bowel
enteritis. Normal appendix and colon.

Vascular/Lymphatic: Mild aortic atherosclerosis. No aneurysm. No
lymphadenopathy.

Reproductive: Hysterectomy.  No adnexal mass.

Other: No free air nor free fluid. Small periumbilical fat
containing hernia.

Musculoskeletal: Slight disc space narrowing with endplate spurring
L5-S1. No aggressive osseous lesions nor fracture.
IMPRESSION: Transmural thickening of fluid-filled mildly distended small bowel
loops without mechanical bowel obstruction are identified in a
pattern compatible with small bowel enteritis.

Status post hysterectomy.

## 2020-11-21 ENCOUNTER — Emergency Department (HOSPITAL_BASED_OUTPATIENT_CLINIC_OR_DEPARTMENT_OTHER)
Admission: EM | Admit: 2020-11-21 | Discharge: 2020-11-21 | Disposition: A | Discharge status: Home / Self Care | Payer: Medicare HMO | Attending: Emergency Medicine | Admitting: Emergency Medicine

## 2020-11-21 ENCOUNTER — Encounter (HOSPITAL_BASED_OUTPATIENT_CLINIC_OR_DEPARTMENT_OTHER): Payer: Self-pay | Admitting: Urology

## 2020-11-21 DIAGNOSIS — E119 Type 2 diabetes mellitus without complications: Secondary | ICD-10-CM | POA: Diagnosis not present

## 2020-11-21 DIAGNOSIS — T162XXA Foreign body in left ear, initial encounter: Secondary | ICD-10-CM | POA: Diagnosis present

## 2020-11-21 DIAGNOSIS — Z794 Long term (current) use of insulin: Secondary | ICD-10-CM | POA: Diagnosis not present

## 2020-11-21 DIAGNOSIS — X58XXXA Exposure to other specified factors, initial encounter: Secondary | ICD-10-CM | POA: Diagnosis not present

## 2020-11-21 DIAGNOSIS — Z79899 Other long term (current) drug therapy: Secondary | ICD-10-CM | POA: Insufficient documentation

## 2020-11-21 DIAGNOSIS — Z7984 Long term (current) use of oral hypoglycemic drugs: Secondary | ICD-10-CM | POA: Insufficient documentation

## 2020-11-21 DIAGNOSIS — I10 Essential (primary) hypertension: Secondary | ICD-10-CM | POA: Diagnosis not present

## 2020-11-21 NOTE — ED Provider Notes (Signed)
[facility] Provider Note   CSN: [id number] Arrival date & time: 11/21/20  2302     History Chief Complaint  Patient presents with   Foreign Body in [place] is a 50 y.o. female.  Patient with history of diabetes presents today with hearing aid piece stuck in her ear.  States that prior to arrival she went to remove her hearing aid and the tip came off and remained lodged in her ear.  She denies any other complaints, no fevers or chills.  No ear pain or discharge.  The history is provided by the patient. No language interpreter was used.  Foreign Body in Ear      Past Medical History:  Diagnosis Date   Diabetes mellitus without complication ([place])    Hard of hearing    Hypertension     There are no problems to display for this patient.   Past Surgical History:  Procedure Laterality Date   ABDOMINAL HYSTERECTOMY       OB History   No obstetric history on file.     History reviewed. No pertinent family history.  Social History   Tobacco Use   Smoking status: Never   Smokeless tobacco: Never  Substance Use Topics   Alcohol use: Never    Home Medications Prior to Admission medications   Medication Sig Start Date End Date Taking? Authorizing Provider  atorvastatin (LIPITOR) 40 MG tablet Take by mouth. 07/03/18   [provider]  Blood Glucose Monitoring Suppl (FIFTY50 GLUCOSE METER 2.0) w/Device KIT Use as instructed. E11.65 03/06/16   [provider]  chlorthalidone (HYGROTON) 25 MG tablet TAKE 1 TABLET BY MOUTH EVERY DAY 07/25/18   [provider]  empagliflozin (JARDIANCE) 25 MG TABS tablet Take by mouth. 06/17/18   [provider]  fluconazole (DIFLUCAN) 200 MG tablet TAKE 1 TABLET BY MOUTH AS NEEDED. REPEAT DOSE AFTER 2 DAYS IF SYMPTOMS STILL PERSIST 12/26/16   [provider]  glucose blood (ONETOUCH ULTRA) test strip USE TO TEST BLOOD SUGAR THREE TIMES DAILY 03/25/18    [provider]  insulin degludec (TRESIBA) 100 UNIT/ML SOPN FlexTouch Pen Inject 80 units daily. 06/17/18   [provider]  Insulin Pen Needle (MM PEN NEEDLES) 32G X 4 MM MISC USE DAILY AS DIRECTED WITH LEVEMIR 01/16/19   [provider]  ondansetron (ZOFRAN ODT) 4 MG disintegrating tablet Take 1 tablet (4 mg total) by mouth every 8 (eight) hours as needed for nausea or vomiting. 09/17/17   Glyn Ade, PA-C  pioglitazone (ACTOS) 30 MG tablet Take by mouth. 08/07/18   [provider]  potassium chloride 20 MEQ TBCR Take 20 mEq by mouth daily for 5 days. 09/17/17 09/22/17  Glyn Ade, PA-C  potassium chloride SA (KLOR-CON) 20 MEQ tablet Take by mouth. 09/17/17   [provider]  potassium chloride SA (KLOR-CON) 20 MEQ tablet Take 1 tablet (20 mEq total) by mouth 2 (two) times daily. 01/27/19   Palumbo, April, MD    Allergies    Lisinopril and Metformin and related  Review of Systems   Review of Systems  Constitutional:  Negative for chills and fever.  HENT:  Negative for ear discharge and ear pain.   Musculoskeletal:  Negative for neck pain and neck stiffness.  All other systems reviewed and are negative.  Physical Exam Updated Vital Signs BP (!) 141/76 (BP Location: Right Arm)   Pulse 87   Temp 98  F (36.7 C) (Oral)   Resp 18   Ht _0  (1.6 m)   Wt 103.9 kg   SpO2 99%   BMI 40.58 kg/m   Physical Exam Vitals and nursing note reviewed.  Constitutional:      General: She is not in acute distress.    Appearance: Normal appearance. She is obese. She is not ill-appearing, toxic-appearing or diaphoretic.  HENT:     Head: Normocephalic and atraumatic.     Ears:     Comments: Foreign body noted in the left ear canal.  No swelling, erythema, or drainage noted. Cardiovascular:     Rate and Rhythm: Normal rate.  Pulmonary:     Effort: Pulmonary effort is normal.  Skin:    General: Skin is warm and dry.  Neurological:     Mental  Status: She is alert.    ED Results / Procedures / Treatments   Labs (all labs ordered are listed, but only abnormal results are displayed) Labs Reviewed - No data to display  EKG None  Radiology No results found.  Procedures .Foreign Body Removal  Date/Time: 11/21/2020 11:39 PM Performed by: Bud Face, PA-C Authorized by: Bud Face, PA-C  Consent: Verbal consent obtained. Risks and benefits: risks, benefits and alternatives were discussed Consent given by: patient Patient understanding: patient states understanding of the procedure being performed Patient consent: the patient's understanding of the procedure matches consent given Patient identity confirmed: verbally with patient Body area: ear Location details: left ear  Sedation: Patient sedated: no  Patient restrained: no Patient cooperative: yes Localization method: visualized Removal mechanism: bayonet forceps Complexity: simple 1 objects recovered. Objects recovered: Tip of hearing aid Post-procedure assessment: foreign body removed Patient tolerance: patient tolerated the procedure well with no immediate complications    Medications Ordered in ED Medications - No data to display  ED Course  I have reviewed the triage vital signs and the nursing notes.  Pertinent labs & imaging results that were available during my care of the patient were reviewed by me and considered in my medical decision making (see chart for details).    MDM Rules/Calculators/A&P                         Patient presents today with the tip of her hearing aid in her left ear.  Same is able to be visualized with naked eye.  Excessively removed with bayonet forceps.  Patient tolerated without difficulty.  She brought with her other tips of the hearing aid and therefore confirmed that entire tip was removed by comparison.  Patient has no other complaints at this time.  Discharged in stable condition.   Final Clinical  Impression(s) / ED Diagnoses Final diagnoses:  Foreign body of left ear, initial encounter    Rx / DC Orders ED Discharge Orders     None     An After Visit Summary was printed and given to the patient.    Nestor Lewandowsky 11/21/20 2342    Fredia Sorrow, MD 12/03/20 [phone]

## 2020-11-21 NOTE — ED Triage Notes (Signed)
Piece of hearing aid stuck in left ear.  

## 2021-04-22 IMAGING — DX DG CHEST 1V PORT
1 series · 1 of 1 positions shown · non-contrast
Comparison: December 25, 2015

CLINICAL DATA: COVID positive

EXAM:
PORTABLE CHEST 1 VIEW

[chest ap]
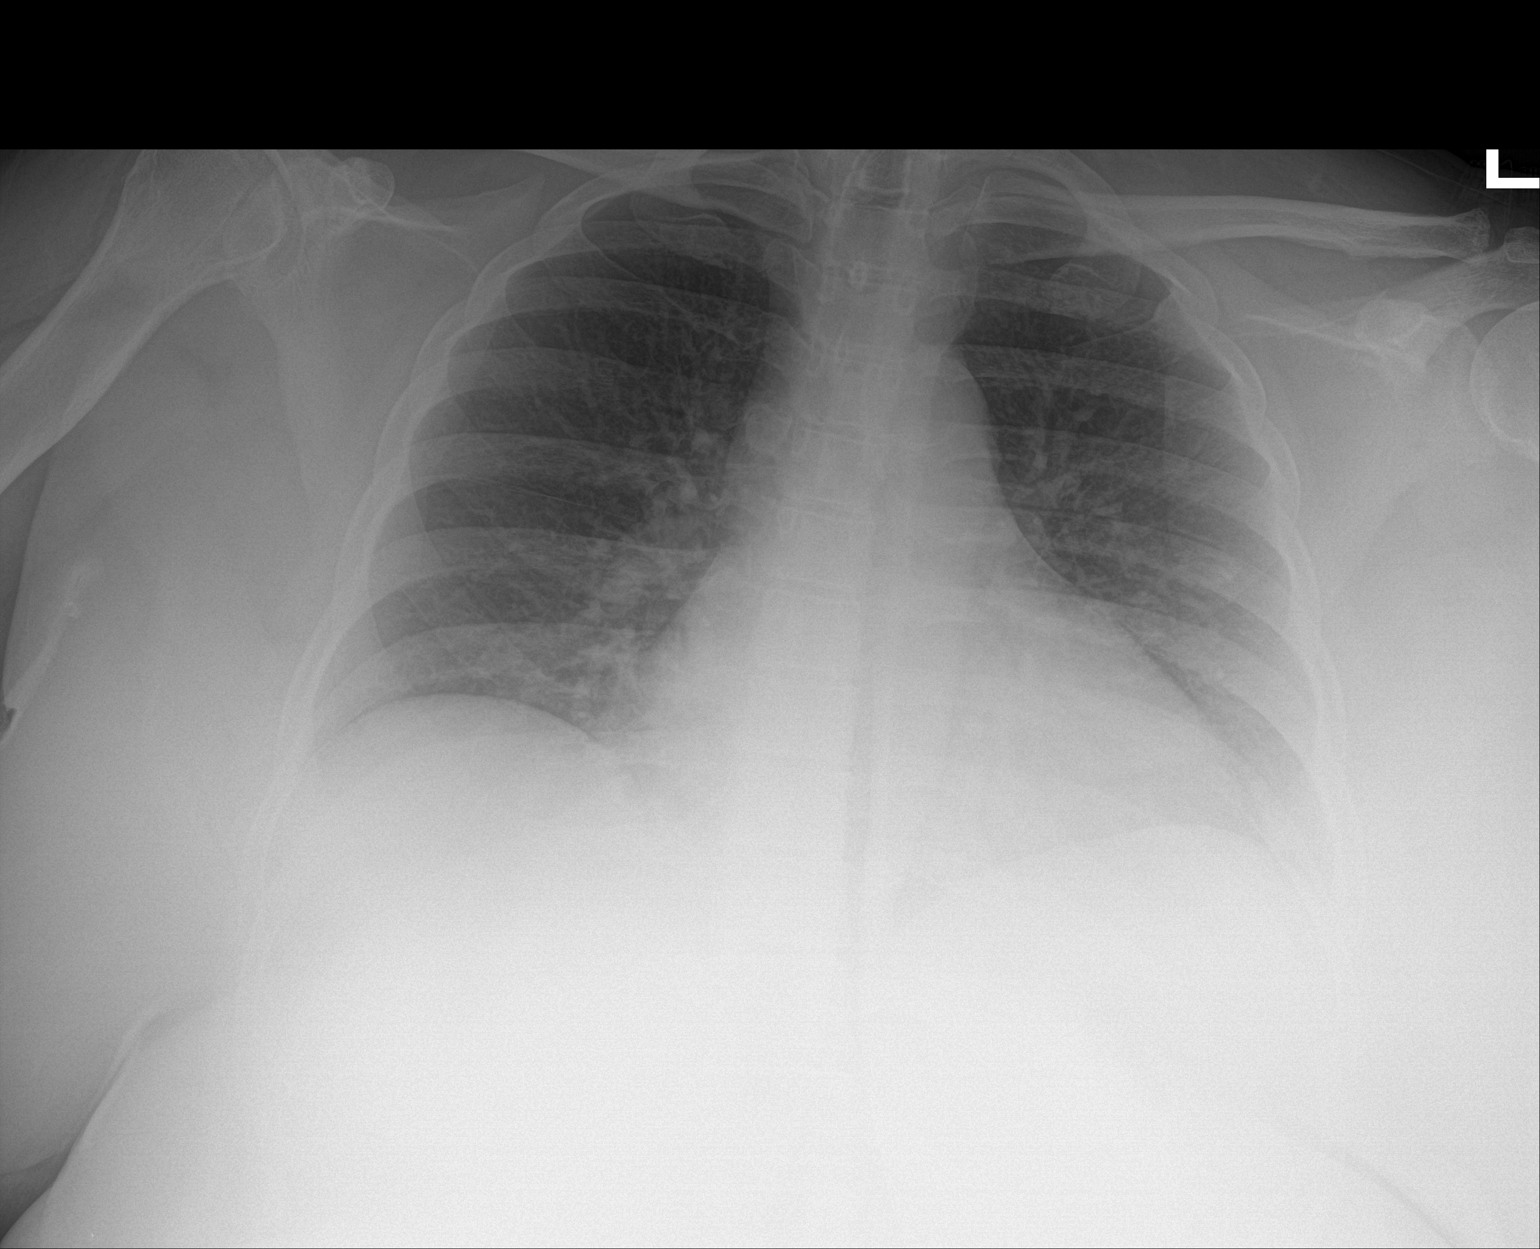

[1 of 1 positions shown; findings below may reference images not displayed]

FINDINGS: The heart size and mediastinal contours are within normal limits.
Both lungs are clear. The visualized skeletal structures are
unremarkable.
IMPRESSION: No active disease.
